# Patient Record
Sex: Female | Born: 1970 | Race: White | Hispanic: No | Marital: Married | Smoking: Former smoker
Health system: Southern US, Community
[De-identification: ages and names within clinical notes are randomized; demographics above are authoritative.]

## PROBLEM LIST (undated history)

## (undated) HISTORY — PX: CHOLECYSTECTOMY: SHX55

---

## 2008-09-23 ENCOUNTER — Emergency Department: Payer: Self-pay | Admitting: Emergency Medicine

## 2010-12-19 ENCOUNTER — Inpatient Hospital Stay: Payer: Self-pay | Admitting: Surgery

## 2010-12-21 LAB — PATHOLOGY REPORT

## 2011-01-03 ENCOUNTER — Ambulatory Visit: Payer: Self-pay | Admitting: Surgery

## 2011-08-29 ENCOUNTER — Ambulatory Visit: Payer: Self-pay

## 2014-01-07 ENCOUNTER — Emergency Department: Payer: Self-pay | Admitting: Emergency Medicine

## 2014-01-07 LAB — URINALYSIS, COMPLETE
BILIRUBIN, UR: NEGATIVE
Bacteria: NONE SEEN
Glucose,UR: NEGATIVE mg/dL (ref 0–75)
Ketone: NEGATIVE
Leukocyte Esterase: NEGATIVE
Nitrite: NEGATIVE
PH: 7 (ref 4.5–8.0)
PROTEIN: NEGATIVE
SPECIFIC GRAVITY: 1.014 (ref 1.003–1.030)
Squamous Epithelial: <1

## 2014-01-07 LAB — COMPREHENSIVE METABOLIC PANEL
ALBUMIN: 3.6 g/dL (ref 3.4–5.0)
ALT: 19 U/L (ref 12–78)
AST: 11 U/L — AB (ref 15–37)
Alkaline Phosphatase: 62 U/L
Anion Gap: 7 (ref 7–16)
BUN: 13 mg/dL (ref 7–18)
Bilirubin,Total: 0.8 mg/dL (ref 0.2–1.0)
CHLORIDE: 103 mmol/L (ref 98–107)
CO2: 27 mmol/L (ref 21–32)
Calcium, Total: 8.9 mg/dL (ref 8.5–10.1)
Creatinine: 0.76 mg/dL (ref 0.60–1.30)
EGFR (African American): >60
GLUCOSE: 110 mg/dL — AB (ref 65–99)
Osmolality: 275 (ref 275–301)
Potassium: 3.7 mmol/L (ref 3.5–5.1)
SODIUM: 137 mmol/L (ref 136–145)
TOTAL PROTEIN: 7.8 g/dL (ref 6.4–8.2)

## 2014-01-07 LAB — CBC WITH DIFFERENTIAL/PLATELET
BASOS ABS: 0.1 10*3/uL (ref 0.0–0.1)
Basophil %: 0.8 %
EOS PCT: 0.8 %
Eosinophil #: 0.1 10*3/uL (ref 0.0–0.7)
HCT: 39.9 % (ref 35.0–47.0)
HGB: 12.7 g/dL (ref 12.0–16.0)
LYMPHS ABS: 1.7 10*3/uL (ref 1.0–3.6)
Lymphocyte %: 14.2 %
MCH: 27.5 pg (ref 26.0–34.0)
MCHC: 32 g/dL (ref 32.0–36.0)
MCV: 86 fL (ref 80–100)
Monocyte #: 1 x10 3/mm — ABNORMAL HIGH (ref 0.2–0.9)
Monocyte %: 8.3 %
NEUTROS ABS: 8.9 10*3/uL — AB (ref 1.4–6.5)
Neutrophil %: 75.9 %
Platelet: 326 10*3/uL (ref 150–440)
RBC: 4.63 10*6/uL (ref 3.80–5.20)
RDW: 14.3 % (ref 11.5–14.5)
WBC: 11.8 10*3/uL — AB (ref 3.6–11.0)

## 2014-01-07 LAB — LIPASE, BLOOD: Lipase: 112 U/L (ref 73–393)

## 2015-07-22 IMAGING — CT CT ABD-PELV W/ CM
2 of 5 series · 17 of 46 positions shown, 19 images · IV contrast (agent unspecified)
Comparison: Ultrasound 12/19/2010, CT 09/24/2008.

CLINICAL DATA: Left lower quadrant pain.

EXAM:
CT ABDOMEN AND PELVIS WITH CONTRAST
TECHNIQUE: Multidetector CT imaging of the abdomen and pelvis was performed
using the standard protocol following bolus administration of
intravenous contrast.
CONTRAST:  100 cc [id number]

[Series 2: routine abd pel with · axial · 0.84mm/px · z∈[-479,-29]mm · 14 of 101 slices shown, 16 images]
[im 6/101  soft-tissue]
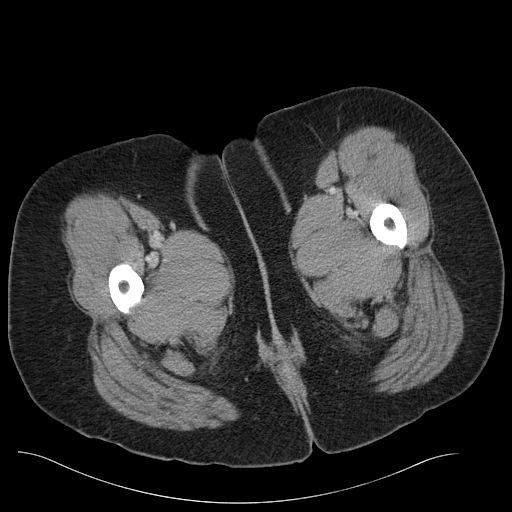
[im 6/101  bone]
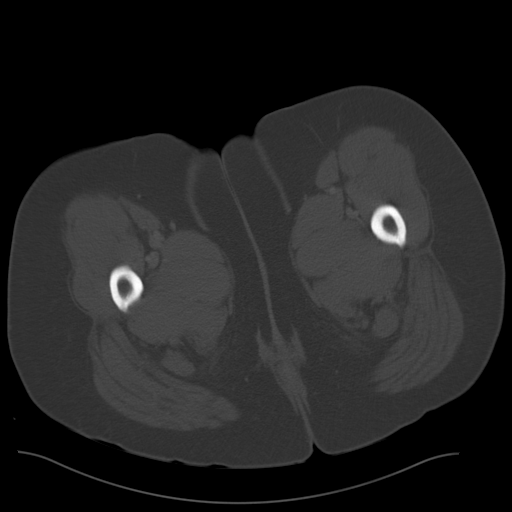
[im 16/101  soft-tissue]
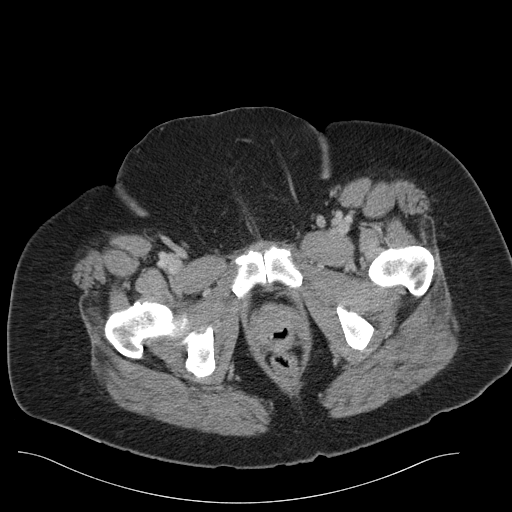
[im 21/101  soft-tissue]
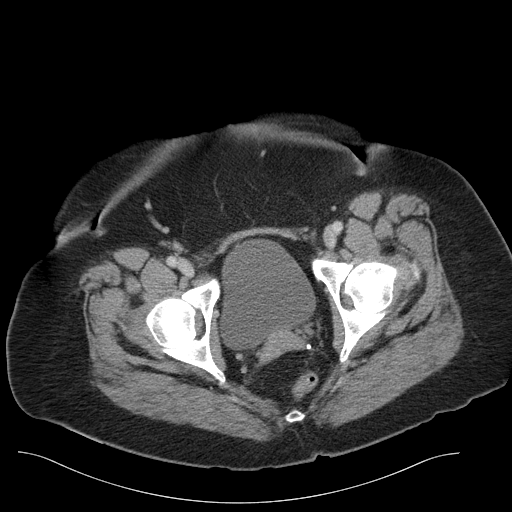
[im 26/101  soft-tissue]
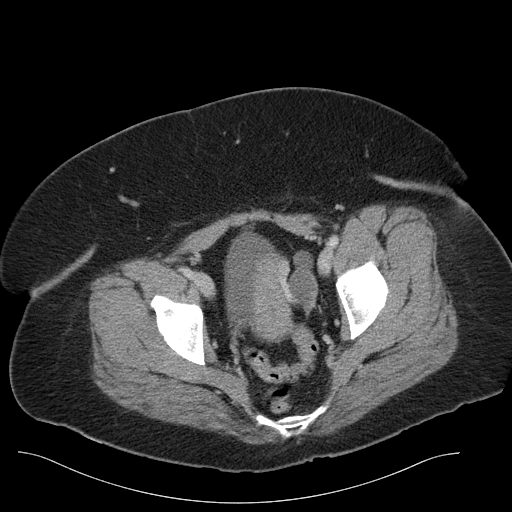
[im 36/101  soft-tissue]
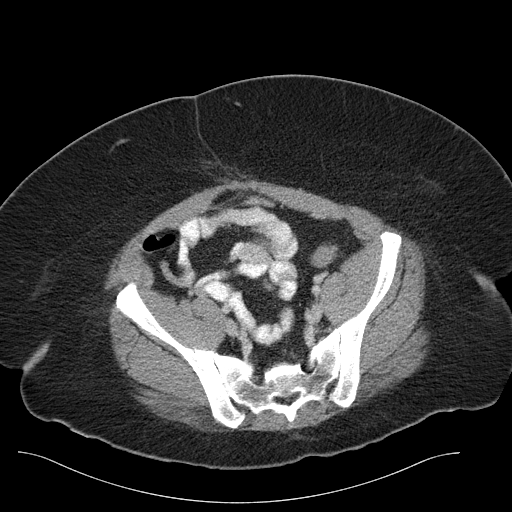
[im 41/101  soft-tissue]
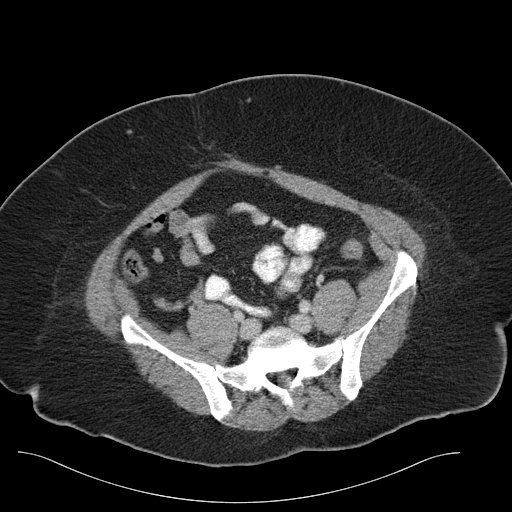
[im 46/101  soft-tissue]
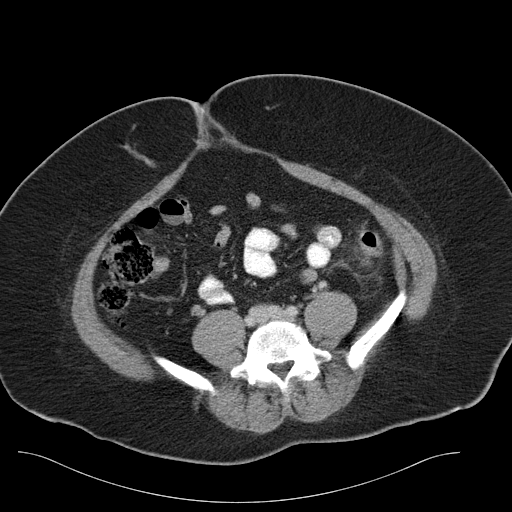
[im 56/101  soft-tissue]
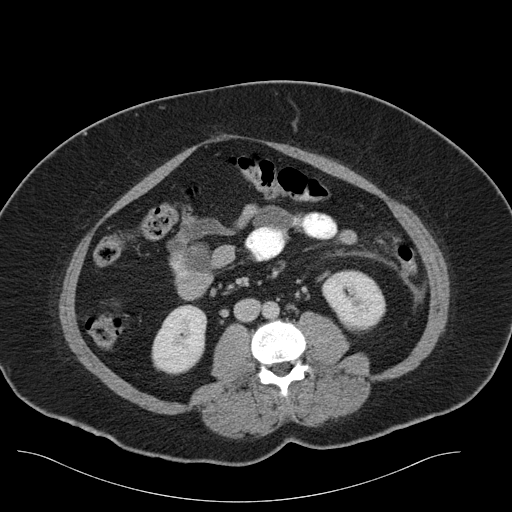
[im 61/101  soft-tissue]
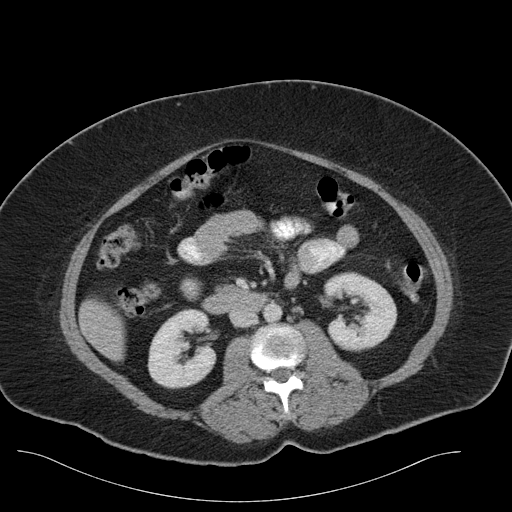
[im 61/101  bone]
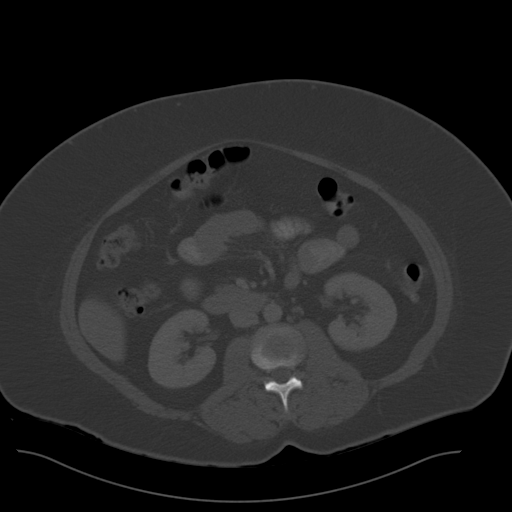
[im 66/101  soft-tissue]
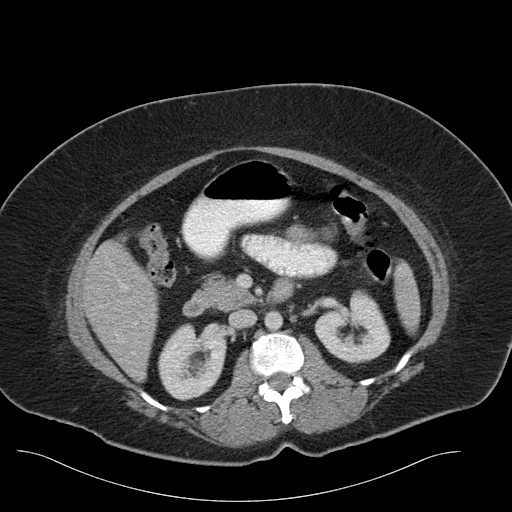
[im 76/101  soft-tissue]
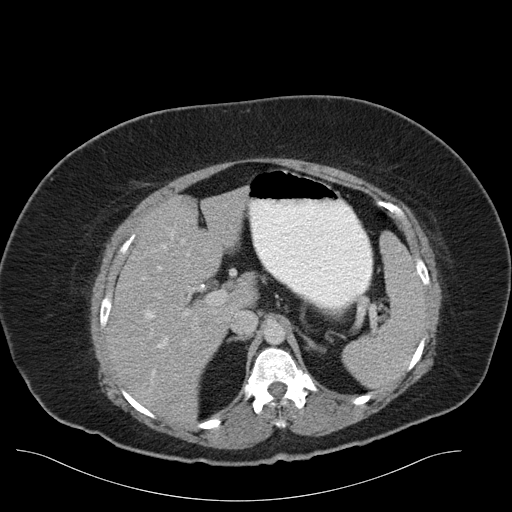
[im 81/101  soft-tissue]
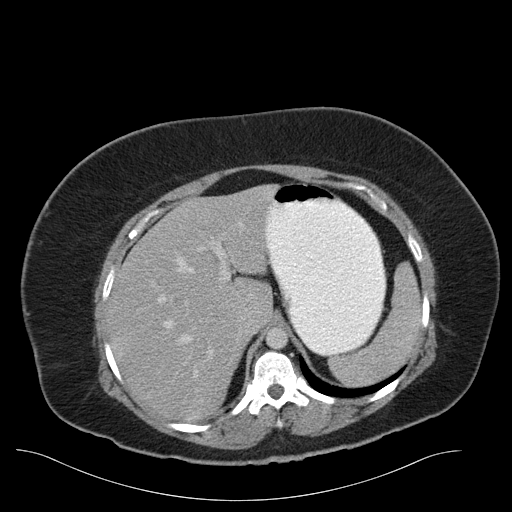
[im 86/101  soft-tissue]
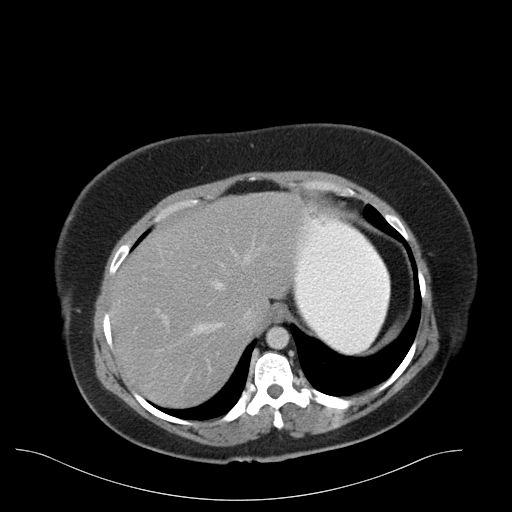
[im 96/101  soft-tissue]
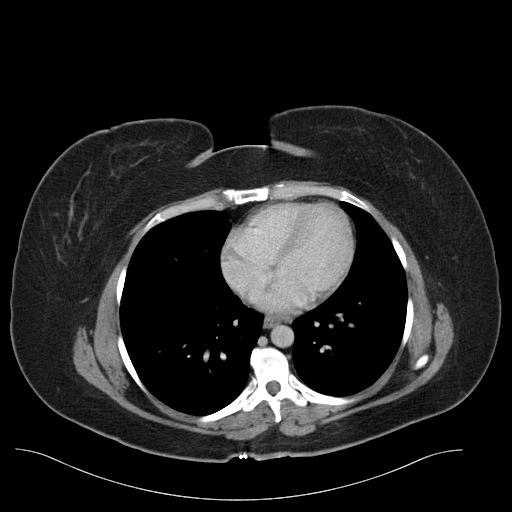

[Series 6: cor routine abd pel with · coronal · 0.94mm/px · 3 of 159 slices shown]
[im 53/159  soft-tissue]
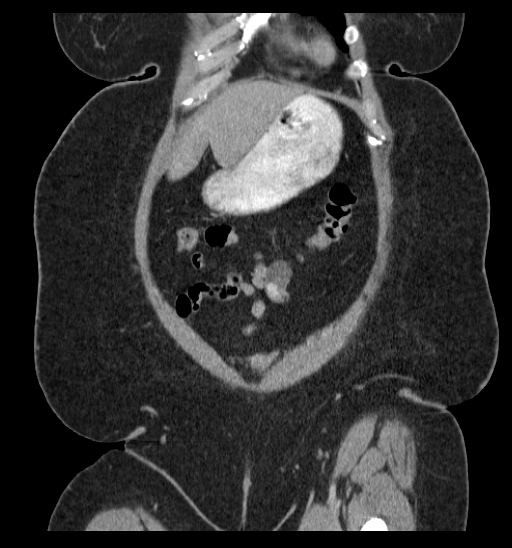
[im 71/159  soft-tissue]
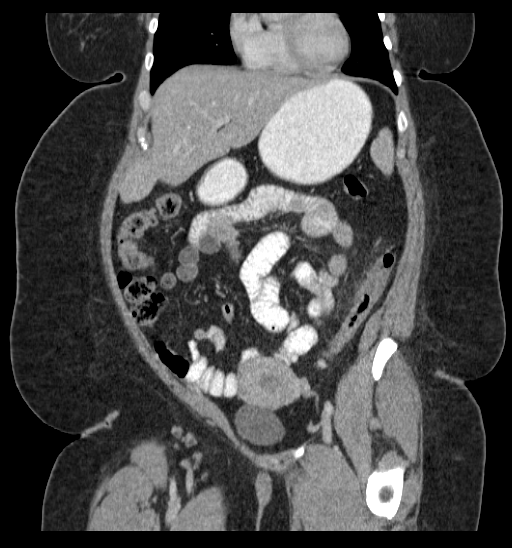
[im 88/159  soft-tissue]
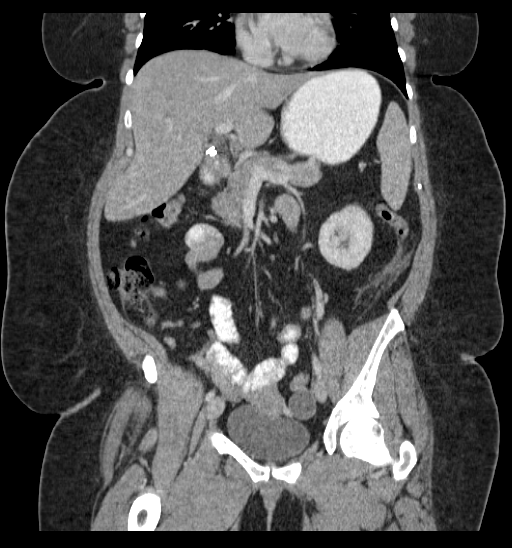

[17 of 46 positions shown; findings below may reference images not displayed]

FINDINGS: Fatty infiltration of the liver. Spleen normal. Pancreas normal.
Cholecystectomy.

Adrenals normal. Simple cyst right kidney. No hydronephrosis or
hydroureter. The bladder is nondistended. Uterus unremarkable.
Indeterminate cyst left ovary/ adnexa largest measures 2.8 cm.
Pelvic ultrasound can be obtained for further evaluation. No free
pelvic fluid.

No adenopathy.  Aorta widely patent.  Distal vessels patent.

Appendix normal. Left pole colonic diverticulosis with surrounding
fat plane edema consistent with diverticulitis. No free air.

Lung bases are clear. Heart size normal. No acute bony abnormality.
IMPRESSION: 1. Left colonic diverticulitis.
2. Fatty infiltration liver.
3. Complex cyst left ovary/adnexa. Slightly larger than on prior
exam of 8131. Pelvic ultrasound can be obtained for further
evaluation.

## 2015-10-24 ENCOUNTER — Ambulatory Visit
Admission: EM | Admit: 2015-10-24 | Discharge: 2015-10-24 | Disposition: A | Discharge status: Home / Self Care | Payer: BLUE CROSS/BLUE SHIELD | Attending: Family Medicine | Admitting: Family Medicine

## 2015-10-24 ENCOUNTER — Encounter: Payer: Self-pay | Admitting: Emergency Medicine

## 2015-10-24 DIAGNOSIS — H6591 Unspecified nonsuppurative otitis media, right ear: Secondary | ICD-10-CM | POA: Diagnosis not present

## 2015-10-24 DIAGNOSIS — J0101 Acute recurrent maxillary sinusitis: Secondary | ICD-10-CM | POA: Diagnosis not present

## 2015-10-24 DIAGNOSIS — H66002 Acute suppurative otitis media without spontaneous rupture of ear drum, left ear: Secondary | ICD-10-CM

## 2015-10-24 DIAGNOSIS — J0111 Acute recurrent frontal sinusitis: Secondary | ICD-10-CM

## 2015-10-24 DIAGNOSIS — J029 Acute pharyngitis, unspecified: Secondary | ICD-10-CM

## 2015-10-24 MED ORDER — SALINE SPRAY 0.65 % NA SOLN: 2.0000 | NASAL | Status: AC

## 2015-10-24 MED ORDER — MAGIC MOUTHWASH W/LIDOCAINE: 15.0000 mL | Freq: Four times a day (QID) | ORAL | Status: AC | PRN

## 2015-10-24 MED ORDER — IBUPROFEN 800 MG PO TABS
800.0000 mg | ORAL_TABLET | Freq: Three times a day (TID) | ORAL | Status: AC
Start: 2015-10-24 — End: ?

## 2015-10-24 MED ORDER — AMOXICILLIN-POT CLAVULANATE 875-125 MG PO TABS: 1.0000 | ORAL_TABLET | Freq: Two times a day (BID) | ORAL | Status: AC

## 2015-10-24 MED ORDER — PREDNISONE 20 MG PO TABS: 40.0000 mg | ORAL_TABLET | Freq: Every day | ORAL | Status: AC

## 2015-10-24 MED ORDER — FLUTICASONE PROPIONATE 50 MCG/ACT NA SUSP: 1.0000 | Freq: Two times a day (BID) | NASAL | Status: AC

## 2015-10-24 MED ORDER — ACETAMINOPHEN 500 MG PO TABS: 1000.0000 mg | ORAL_TABLET | Freq: Four times a day (QID) | ORAL | Status: AC | PRN

## 2015-10-24 NOTE — ED Provider Notes (Signed)
CSN: [id number]     Arrival date & time 10/24/15  1039 History   First MD Initiated Contact with Patient 10/24/15 1159     Chief Complaint  Patient presents with  . Nasal Congestion  . Facial Pain   (Consider location/radiation/quality/duration/timing/severity/associated sxs/prior Treatment) HPI Comments: Married caucasian female here for evaluation of left ear and facial pain I think I have sinus infection sudafed and motrin not helping Tmax 102 yesterday teeth, eyes and ears hurting.  Denied history seasonal allergies.  Works at [employer] sick contacts.  Denied cancer in family members  The history is provided by the patient.    History reviewed. No pertinent past medical history. Past Surgical History  Procedure Laterality Date  . Cholecystectomy    . Cesarean section     History reviewed. No pertinent family history. Social History  Substance Use Topics  . Smoking status: Former Games developer  . Smokeless tobacco: None  . Alcohol Use: Yes   OB History    No data available     Review of Systems  Constitutional: Positive for fever. Negative for chills, diaphoresis, activity change, appetite change, fatigue and unexpected weight change.  HENT: Positive for congestion, ear pain, postnasal drip, sinus pressure and sore throat. Negative for dental problem, drooling, ear discharge, facial swelling, hearing loss, mouth sores, nosebleeds, rhinorrhea, sneezing, tinnitus, trouble swallowing and voice change.   Eyes: Negative for photophobia, pain, discharge, redness, itching and visual disturbance.  Respiratory: Negative for cough, choking, chest tightness, shortness of breath, wheezing and stridor.   Cardiovascular: Negative for chest pain, palpitations and leg swelling.  Gastrointestinal: Negative for nausea, vomiting, abdominal pain, diarrhea, constipation, blood in stool and abdominal distention.  Endocrine: Negative for cold intolerance and heat intolerance.  Genitourinary:  Negative for dysuria, hematuria and difficulty urinating.  Musculoskeletal: Negative for myalgias, back pain, joint swelling, arthralgias, gait problem, neck pain and neck stiffness.  Skin: Negative for color change, pallor, rash and wound.  Allergic/Immunologic: Negative for environmental allergies and food allergies.  Neurological: Positive for headaches. Negative for dizziness, tremors, seizures, syncope, facial asymmetry, speech difficulty, weakness, light-headedness and numbness.  Hematological: Negative for adenopathy. Does not bruise/bleed easily.  Psychiatric/Behavioral: Negative for behavioral problems, confusion, sleep disturbance and agitation.    Allergies  Review of patient's allergies indicates no known allergies.  Home Medications   Prior to Admission medications   Medication Sig Start Date End Date Taking? Authorizing Provider  acetaminophen (TYLENOL) 500 MG tablet Take 2 tablets (1,000 mg total) by mouth every 6 (six) hours as needed for mild pain, moderate pain, fever or headache. 10/24/15   Barbaraann Barthel, NP  amoxicillin-clavulanate (AUGMENTIN) 875-125 MG tablet Take 1 tablet by mouth every 12 (twelve) hours. 10/24/15   Barbaraann Barthel, NP  fluticasone (FLONASE) 50 MCG/ACT nasal spray Place 1 spray into both nostrils 2 (two) times daily. 10/24/15   Barbaraann Barthel, NP  ibuprofen (ADVIL,MOTRIN) 800 MG tablet Take 1 tablet (800 mg total) by mouth 3 (three) times daily. 10/24/15   Barbaraann Barthel, NP  magic mouthwash w/lidocaine SOLN Take 15 mLs by mouth 4 (four) times daily as needed for mouth pain. 10/24/15   Barbaraann Barthel, NP  predniSONE (DELTASONE) 20 MG tablet Take 2 tablets (40 mg total) by mouth daily with breakfast. 10/24/15   Barbaraann Barthel, NP  sodium chloride (OCEAN) 0.65 % SOLN nasal spray Place 2 sprays into both nostrils every 2 (two) hours while awake. 10/24/15   Barbaraann Barthel,  NP   Meds Ordered and Administered this Visit  Medications - No data  to display  BP 105/66 mmHg  Pulse 80  Temp(Src) 98.3 F (36.8 C) (Tympanic)  Resp 16  Ht  (1.575 m)  Wt 228 lb (103.42 kg)  BMI 41.69 kg/m2  SpO2 99%  LMP 09/28/2015 (Approximate) No data found.   Physical Exam  Constitutional: She is oriented to person, place, and time. She appears well-developed and well-nourished. She is active and cooperative.  Non-toxic appearance. She does not have a sickly appearance. She appears ill. No distress.  HENT:  Head: Normocephalic and atraumatic.  Right Ear: Hearing, external ear and ear canal normal. A middle ear effusion is present.  Left Ear: Hearing, external ear and ear canal normal. Tympanic membrane is injected, erythematous and bulging. A middle ear effusion is present.  Nose: Mucosal edema and rhinorrhea present. No nose lacerations, sinus tenderness, nasal deformity, septal deviation or nasal septal hematoma. No epistaxis.  No foreign bodies. Right sinus exhibits no maxillary sinus tenderness and no frontal sinus tenderness. Left sinus exhibits maxillary sinus tenderness and frontal sinus tenderness.  Mouth/Throat: Uvula is midline and mucous membranes are normal. Mucous membranes are not pale, not dry and not cyanotic. She does not have dentures. No oral lesions. No trismus in the jaw. Normal dentition. No dental abscesses, uvula swelling, lacerations or dental caries. Posterior oropharyngeal edema and posterior oropharyngeal erythema present. No oropharyngeal exudate or tonsillar abscesses.  Bilateral tonsils 3+/4 erythema/edema; cobblestoning posterior pharynx; left TM erythema/bulging/opacity; right TM clear air fluid level; left anterior cervical lymph nodes TTP; left maxillary and frontal sinuses TTP; bilateral nasal turbinates with edema/erythema/yellow discharge; nasal congestion; hoarse voice  Eyes: Conjunctivae, EOM and lids are normal. Pupils are equal, round, and reactive to light. Right eye exhibits no chemosis, no discharge, no  exudate and no hordeolum. No foreign body present in the right eye. Left eye exhibits no chemosis, no discharge, no exudate and no hordeolum. No foreign body present in the left eye. Right conjunctiva is not injected. Right conjunctiva has no hemorrhage. Left conjunctiva is not injected. Left conjunctiva has no hemorrhage. No scleral icterus. Right eye exhibits normal extraocular motion and no nystagmus. Left eye exhibits normal extraocular motion and no nystagmus. Right pupil is round and reactive. Left pupil is round and reactive. Pupils are equal.  Neck: Trachea normal and normal range of motion. Neck supple. No tracheal tenderness, no spinous process tenderness and no muscular tenderness present. No rigidity. No tracheal deviation, no edema, no erythema and normal range of motion present. No thyroid mass and no thyromegaly present.  Cardiovascular: Normal rate, regular rhythm, S1 normal, S2 normal, normal heart sounds and intact distal pulses.  PMI is not displaced.  Exam reveals no gallop and no friction rub.   No murmur heard. Pulmonary/Chest: Effort normal and breath sounds normal. No accessory muscle usage or stridor. No respiratory distress. She has no decreased breath sounds. She has no wheezes. She has no rhonchi. She has no rales. She exhibits no tenderness.  Abdominal: Soft. She exhibits no distension.  Musculoskeletal: Normal range of motion. She exhibits no edema or tenderness.       Right shoulder: Normal.       Left shoulder: Normal.       Right hip: Normal.       Left hip: Normal.       Right knee: Normal.       Left knee: Normal.  Cervical back: Normal.       Right hand: Normal.       Left hand: Normal.  Lymphadenopathy:       Head (right side): No submental, no submandibular, no tonsillar, no preauricular, no posterior auricular and no occipital adenopathy present.       Head (left side): No submental, no submandibular, no tonsillar, no preauricular, no posterior auricular  and no occipital adenopathy present.    She has no cervical adenopathy.       Right cervical: No superficial cervical, no deep cervical and no posterior cervical adenopathy present.      Left cervical: No superficial cervical, no deep cervical and no posterior cervical adenopathy present.  Neurological: She is alert and oriented to person, place, and time. She has normal strength. She is not disoriented. She displays no atrophy and no tremor. No cranial nerve deficit or sensory deficit. She exhibits normal muscle tone. She displays no seizure activity. Coordination and gait normal. GCS eye subscore is 4. GCS verbal subscore is 5. GCS motor subscore is 6.  Skin: Skin is warm, dry and intact. No abrasion, no bruising, no burn, no ecchymosis, no laceration, no lesion, no petechiae and no rash noted. She is not diaphoretic. No cyanosis or erythema. No pallor. Nails show no clubbing.  Psychiatric: She has a normal mood and affect. Her speech is normal and behavior is normal. Judgment and thought content normal. Cognition and memory are normal.  Nursing note and vitals reviewed.   ED Course  Procedures (including critical care time)  Labs Review Labs Reviewed - No data to display  Imaging Review No results found.    MDM   1. Acute recurrent frontal sinusitis   2. Acute recurrent maxillary sinusitis   3. Acute suppurative otitis media of left ear without spontaneous rupture of tympanic membrane, recurrence not specified   4. Otitis media with effusion, right   5. Acute pharyngitis, unspecified etiology    augmentin 875mg  po BID x 10days left otitis media.  Tylenol 1000mg  po QID prn or motrin 800mg  po TID prn.  No evidence of invasive bacterial infection, non toxic and well hydrated.  This is most likely self limiting viral infection.  I do not see where any further testing or imaging is necessary at this time.   I will suggest supportive care, rest, good hygiene and encourage the patient to  take adequate fluids.  The patient is to return to clinic or EMERGENCY ROOM if symptoms worsen or change significantly e.g. ear pain, fever, purulent discharge from ears or bleeding.  Exitcare handout on otitis media with effusion and otitis media given to patient.  Patient verbalized agreement and understanding of treatment plan and had no further questions at this time.    augmentin 875mg  po BID x 10 days, flonase 1 spray each nostril BID, saline 2 sprays each nostril q2h prn congestion. Given rx prednisone 40mg  po daily x 5 days if no improvement with augmentin/flonase x 48 hours enlarged tonsils.  Go to ER if dyspnea/dysphagia/drooling for re-evaluation.  No evidence of systemic bacterial infection, non toxic and well hydrated.  I do not see where any further testing or imaging is necessary at this time.   I will suggest supportive care, rest, good hygiene and encourage the patient to take adequate fluids.  The patient is to return to clinic or EMERGENCY ROOM if symptoms worsen or change significantly.  Exitcare handout on sinusitis given to patient.  Patient verbalized agreement and  understanding of treatment plan and had no further questions at this time.   P2:  Hand washing and cover cough  Refused work excuse typically 24 hours given.  Start augmentin 875mg  po BID x 10 days.  Change toothbrush in 8 days.  Magic mouthwash with lidocaine gargle and swallow 15ml po QID prn pain (max 300mg  viscous lidocaine per dose 15ml of 20mg /ml).  Tylenol 1000mg  po qid prn or motrin 800mg  po TID prn.  Usually no specific medical treatment is needed if a virus is causing the sore throat.  The throat most often gets better on its own within 5 to 7 days.  Antibiotic medicine does not cure viral pharyngitis.   For acute pharyngitis caused by bacteria, your healthcare provider will prescribe an antibiotic.  Marland Kitchen Do not smoke.  Marland Kitchen Avoid secondhand smoke and other air pollutants.  . Use a cool mist humidifier to add moisture  to the air.  . Get plenty of rest.  . You may want to rest your throat by talking less and eating a diet that is mostly liquid or soft for a day or two.   Marland Kitchen Nonprescription throat lozenges and mouthwashes should help relieve the soreness.   . Gargling with warm saltwater and drinking warm liquids may help.  (You can make a saltwater solution by adding 1/4 teaspoon of salt to 8 ounces, or 240 mL, of warm water.)  . A nonprescription pain reliever such as aspirin, acetaminophen, or ibuprofen may ease general aches and pains.   FOLLOW UP with clinic provider if no improvements in the next 7-10 days.  Patient verbalized understanding of instructions and agreed with plan of care. P2:  Hand washing and diet.    Barbaraann Barthel, NP 10/24/15 1248

## 2015-10-24 NOTE — ED Notes (Signed)
Patient c/o sinus pain and pressure, runny nose, HAs, and ear pain for the past 4 days.

## 2015-10-24 NOTE — Discharge Instructions (Signed)
Otitis Media, Adult  Otitis media is redness, soreness, and inflammation of the middle ear. Otitis media may be caused by allergies or, most commonly, by infection. Often it occurs as a complication of the common cold.  SIGNS AND SYMPTOMS  Symptoms of otitis media may include:   Earache.   Fever.   Ringing in your ear.   Headache.   Leakage of fluid from the ear.  DIAGNOSIS  To diagnose otitis media, your health care provider will examine your ear with an otoscope. This is an instrument that allows your health care provider to see into your ear in order to examine your eardrum. Your health care provider also will ask you questions about your symptoms.  TREATMENT   Typically, otitis media resolves on its own within 3-5 days. Your health care provider may prescribe medicine to ease your symptoms of pain. If otitis media does not resolve within 5 days or is recurrent, your health care provider may prescribe antibiotic medicines if he or she suspects that a bacterial infection is the cause.  HOME CARE INSTRUCTIONS    If you were prescribed an antibiotic medicine, finish it all even if you start to feel better.   Take medicines only as directed by your health care provider.   Keep all follow-up visits as directed by your health care provider.  SEEK MEDICAL CARE IF:   You have otitis media only in one ear, or bleeding from your nose, or both.   You notice a lump on your neck.   You are not getting better in 3-5 days.   You feel worse instead of better.  SEEK IMMEDIATE MEDICAL CARE IF:    You have pain that is not controlled with medicine.   You have swelling, redness, or pain around your ear or stiffness in your neck.   You notice that part of your face is paralyzed.   You notice that the bone behind your ear (mastoid) is tender when you touch it.  MAKE SURE YOU:    Understand these instructions.   Will watch your condition.   Will get help right away if you are not doing well or get worse.     This  information is not intended to replace advice given to you by your health care provider. Make sure you discuss any questions you have with your health care provider.     Document Released: 06/01/2004 Document Revised: 09/17/2014 Document Reviewed: 03/24/2013  [employer]vier Interactive Patient Education 2016 [employer]  Pharyngitis  Pharyngitis is redness, pain, and swelling (inflammation) of your pharynx.   CAUSES   Pharyngitis is usually caused by infection. Most of the time, these infections are from viruses (viral) and are part of a cold. However, sometimes pharyngitis is caused by bacteria (bacterial). Pharyngitis can also be caused by allergies. Viral pharyngitis may be spread from person to person by coughing, sneezing, and personal items or utensils (cups, forks, spoons, toothbrushes). Bacterial pharyngitis may be spread from person to person by more intimate contact, such as kissing.   SIGNS AND SYMPTOMS   Symptoms of pharyngitis include:    Sore throat.    Tiredness (fatigue).    Low-grade fever.    Headache.   Joint pain and muscle aches.   Skin rashes.   Swollen lymph nodes.   Plaque-like film on throat or tonsils (often seen with bacterial pharyngitis).  DIAGNOSIS   Your health care provider will ask you questions about your illness and your symptoms. Your medical history,   pharyngitis is treated with medicines that kill germs (antibiotics).  HOME CARE INSTRUCTIONS   Drink enough water and fluids to keep your urine clear or pale yellow.   Only take over-the-counter or prescription medicines as directed by your health care provider:   If you are prescribed antibiotics, make sure you finish them  even if you start to feel better.   Do not take aspirin.   Get lots of rest.   Gargle with 8 oz of salt water ( tsp of salt per 1 qt of water) as often as every 1-2 hours to soothe your throat.   Throat lozenges (if you are not at risk for choking) or sprays may be used to soothe your throat. SEEK MEDICAL CARE IF:   You have large, tender lumps in your neck.  You have a rash.  You cough up green, yellow-brown, or bloody spit. SEEK IMMEDIATE MEDICAL CARE IF:   Your neck becomes stiff.  You drool or are unable to swallow liquids.  You vomit or are unable to keep medicines or liquids down.  You have severe pain that does not go away with the use of recommended medicines.  You have trouble breathing (not caused by a stuffy nose). MAKE SURE YOU:   Understand these instructions.  Will watch your condition.  Will get help right away if you are not doing well or get worse.   This information is not intended to replace advice given to you by your health care provider. Make sure you discuss any questions you have with your health care provider.   Document Released: 08/27/2005 Document Revised: 06/17/2013 Document Reviewed: 05/04/2013 Elsevier Interactive Patient Education 2016 [employer] Otitis Media With Effusion Otitis media with effusion is the presence of fluid in the middle ear. This is a common problem in children, which often follows ear infections. It may be present for weeks or longer after the infection. Unlike an acute ear infection, otitis media with effusion refers only to fluid behind the ear drum and not infection. Children with repeated ear and sinus infections and allergy problems are the most likely to get otitis media with effusion. CAUSES  The most frequent cause of the fluid buildup is dysfunction of the eustachian tubes. These are the tubes that drain fluid in the ears to the back of the nose (nasopharynx). SYMPTOMS   The main symptom of this  condition is hearing loss. As a result, you or your child may:  Listen to the TV at a loud volume.  Not respond to questions.  Ask "what" often when spoken to.  Mistake or confuse one sound or word for another.  There may be a sensation of fullness or pressure but usually not pain. DIAGNOSIS   Your health care provider will diagnose this condition by examining you or your child's ears.  Your health care provider may test the pressure in you or your child's ear with a tympanometer.  A hearing test may be conducted if the problem persists. TREATMENT   Treatment depends on the duration and the effects of the effusion.  Antibiotics, decongestants, nose drops, and cortisone-type drugs (tablets or nasal spray) may not be helpful.  Children with persistent ear effusions may have delayed language or behavioral problems. Children at risk for developmental delays in hearing, learning, and speech may require referral to a specialist earlier than children not at risk.  You or your child's health care provider may suggest a referral to an ear, nose, and throat surgeon for  treatment. The following may help restore normal hearing:  Drainage of fluid.  Placement of ear tubes (tympanostomy tubes).  Removal of adenoids (adenoidectomy). HOME CARE INSTRUCTIONS   Avoid secondhand smoke.  Infants who are breastfed are less likely to have this condition.  Avoid feeding infants while they are lying flat.  Avoid known environmental allergens.  Avoid people who are sick. SEEK MEDICAL CARE IF:   Hearing is not better in 3 months.  Hearing is worse.  Ear pain.  Drainage from the ear.  Dizziness. MAKE SURE YOU:   Understand these instructions.  Will watch your condition.  Will get help right away if you are not doing well or get worse.   This information is not intended to replace advice given to you by your health care provider. Make sure you discuss any questions you have with  your health care provider.   Document Released: 10/04/2004 Document Revised: 09/17/2014 Document Reviewed: 03/24/2013 Elsevier Interactive Patient Education 2016 [employer] Sinusitis, Adult Sinusitis is redness, soreness, and inflammation of the paranasal sinuses. Paranasal sinuses are air pockets within the bones of your face. They are located beneath your eyes, in the middle of your forehead, and above your eyes. In healthy paranasal sinuses, mucus is able to drain out, and air is able to circulate through them by way of your nose. However, when your paranasal sinuses are inflamed, mucus and air can become trapped. This can allow bacteria and other germs to grow and cause infection. Sinusitis can develop quickly and last only a short time (acute) or continue over a long period (chronic). Sinusitis that lasts for more than 12 weeks is considered chronic. CAUSES Causes of sinusitis include:  Allergies.  Structural abnormalities, such as displacement of the cartilage that separates your nostrils (deviated septum), which can decrease the air flow through your nose and sinuses and affect sinus drainage.  Functional abnormalities, such as when the small hairs (cilia) that line your sinuses and help remove mucus do not work properly or are not present. SIGNS AND SYMPTOMS Symptoms of acute and chronic sinusitis are the same. The primary symptoms are pain and pressure around the affected sinuses. Other symptoms include:  Upper toothache.  Earache.  Headache.  Bad breath.  Decreased sense of smell and taste.  A cough, which worsens when you are lying flat.  Fatigue.  Fever.  Thick drainage from your nose, which often is green and may contain pus (purulent).  Swelling and warmth over the affected sinuses. DIAGNOSIS Your health care provider will perform a physical exam. During your exam, your health care provider may perform any of the following to help determine if you have acute  sinusitis or chronic sinusitis:  Look in your nose for signs of abnormal growths in your nostrils (nasal polyps).  Tap over the affected sinus to check for signs of infection.  View the inside of your sinuses using an imaging device that has a light attached (endoscope). If your health care provider suspects that you have chronic sinusitis, one or more of the following tests may be recommended:  Allergy tests.  Nasal culture. A sample of mucus is taken from your nose, sent to a lab, and screened for bacteria.  Nasal cytology. A sample of mucus is taken from your nose and examined by your health care provider to determine if your sinusitis is related to an allergy. TREATMENT Most cases of acute sinusitis are related to a viral infection and will resolve on their own within 10  days. Sometimes, medicines are prescribed to help relieve symptoms of both acute and chronic sinusitis. These may include pain medicines, decongestants, nasal steroid sprays, or saline sprays. However, for sinusitis related to a bacterial infection, your health care provider will prescribe antibiotic medicines. These are medicines that will help kill the bacteria causing the infection. Rarely, sinusitis is caused by a fungal infection. In these cases, your health care provider will prescribe antifungal medicine. For some cases of chronic sinusitis, surgery is needed. Generally, these are cases in which sinusitis recurs more than 3 times per year, despite other treatments. HOME CARE INSTRUCTIONS  Drink plenty of water. Water helps thin the mucus so your sinuses can drain more easily.  Use a humidifier.  Inhale steam 3-4 times a day (for example, sit in the bathroom with the shower running).  Apply a warm, moist washcloth to your face 3-4 times a day, or as directed by your health care provider.  Use saline nasal sprays to help moisten and clean your sinuses.  Take medicines only as directed by your health care  provider.  If you were prescribed either an antibiotic or antifungal medicine, finish it all even if you start to feel better. SEEK IMMEDIATE MEDICAL CARE IF:  You have increasing pain or severe headaches.  You have nausea, vomiting, or drowsiness.  You have swelling around your face.  You have vision problems.  You have a stiff neck.  You have difficulty breathing.   This information is not intended to replace advice given to you by your health care provider. Make sure you discuss any questions you have with your health care provider.   Document Released: 08/27/2005 Document Revised: 09/17/2014 Document Reviewed: 09/11/2011 [employer] Interactive Patient Education [employer]
